# Patient Record
Sex: Female | Born: 1975 | Race: White | Hispanic: No | Marital: Married | Smoking: Former smoker
Health system: Southern US, Community
[De-identification: ages and names within clinical notes are randomized; demographics above are authoritative.]

## PROBLEM LIST (undated history)

## (undated) DIAGNOSIS — N83209 Unspecified ovarian cyst, unspecified side: Secondary | ICD-10-CM

## (undated) HISTORY — PX: CHOLECYSTECTOMY: SHX55

## (undated) HISTORY — PX: LAPAROSCOPIC ABDOMINAL EXPLORATION: SHX6249

---

## 2013-09-12 ENCOUNTER — Encounter (HOSPITAL_COMMUNITY): Payer: Self-pay | Admitting: *Deleted

## 2013-09-12 ENCOUNTER — Inpatient Hospital Stay (HOSPITAL_COMMUNITY)
Admission: AD | Admit: 2013-09-12 | Discharge: 2013-09-13 | Disposition: A | Discharge status: Home / Self Care | Payer: BC Managed Care – PPO | Source: Ambulatory Visit | Attending: Obstetrics & Gynecology | Admitting: Obstetrics & Gynecology

## 2013-09-12 ENCOUNTER — Inpatient Hospital Stay (HOSPITAL_COMMUNITY): Payer: BC Managed Care – PPO

## 2013-09-12 DIAGNOSIS — IMO0002 Reserved for concepts with insufficient information to code with codable children: Secondary | ICD-10-CM | POA: Insufficient documentation

## 2013-09-12 DIAGNOSIS — N3000 Acute cystitis without hematuria: Secondary | ICD-10-CM

## 2013-09-12 DIAGNOSIS — R51 Headache: Secondary | ICD-10-CM | POA: Insufficient documentation

## 2013-09-12 DIAGNOSIS — R109 Unspecified abdominal pain: Secondary | ICD-10-CM | POA: Insufficient documentation

## 2013-09-12 DIAGNOSIS — Z87891 Personal history of nicotine dependence: Secondary | ICD-10-CM | POA: Insufficient documentation

## 2013-09-12 DIAGNOSIS — G8918 Other acute postprocedural pain: Secondary | ICD-10-CM | POA: Insufficient documentation

## 2013-09-12 DIAGNOSIS — N9989 Other postprocedural complications and disorders of genitourinary system: Secondary | ICD-10-CM | POA: Insufficient documentation

## 2013-09-12 DIAGNOSIS — Y838 Other surgical procedures as the cause of abnormal reaction of the patient, or of later complication, without mention of misadventure at the time of the procedure: Secondary | ICD-10-CM | POA: Insufficient documentation

## 2013-09-12 DIAGNOSIS — N3001 Acute cystitis with hematuria: Secondary | ICD-10-CM

## 2013-09-12 DIAGNOSIS — N390 Urinary tract infection, site not specified: Secondary | ICD-10-CM | POA: Insufficient documentation

## 2013-09-12 HISTORY — DX: Unspecified ovarian cyst, unspecified side: N83.209

## 2013-09-12 LAB — CBC
HCT: 39.7 % (ref 36.0–46.0)
Hemoglobin: 12.4 g/dL (ref 12.0–15.0)
MCH: 29 pg (ref 26.0–34.0)
MCHC: 31.2 g/dL (ref 30.0–36.0)
MCV: 92.8 fL (ref 78.0–100.0)
Platelets: 199 10*3/uL (ref 150–400)
RBC: 4.28 MIL/uL (ref 3.87–5.11)
RDW: 13.5 % (ref 11.5–15.5)
WBC: 11.2 10*3/uL — AB (ref 4.0–10.5)

## 2013-09-12 LAB — URINE MICROSCOPIC-ADD ON

## 2013-09-12 LAB — URINALYSIS, ROUTINE W REFLEX MICROSCOPIC
Bilirubin Urine: NEGATIVE
Glucose, UA: NEGATIVE mg/dL
KETONES UR: NEGATIVE mg/dL
NITRITE: NEGATIVE
PH: 5 (ref 5.0–8.0)
Protein, ur: NEGATIVE mg/dL
Specific Gravity, Urine: 1.01 (ref 1.005–1.030)
UROBILINOGEN UA: 0.2 mg/dL (ref 0.0–1.0)

## 2013-09-12 LAB — COMPREHENSIVE METABOLIC PANEL
ALBUMIN: 4 g/dL (ref 3.5–5.2)
ALT: 44 U/L — ABNORMAL HIGH (ref 0–35)
AST: 50 U/L — ABNORMAL HIGH (ref 0–37)
Alkaline Phosphatase: 69 U/L (ref 39–117)
Anion gap: 12 (ref 5–15)
BUN: 13 mg/dL (ref 6–23)
CHLORIDE: 103 meq/L (ref 96–112)
CO2: 29 mEq/L (ref 19–32)
CREATININE: 0.68 mg/dL (ref 0.50–1.10)
Calcium: 9.2 mg/dL (ref 8.4–10.5)
GFR calc Af Amer: >90 mL/min (ref >90)
GFR calc non Af Amer: >90 mL/min (ref >90)
Glucose, Bld: 91 mg/dL (ref 70–99)
Potassium: 4.4 mEq/L (ref 3.7–5.3)
Sodium: 144 mEq/L (ref 137–147)
TOTAL PROTEIN: 7.2 g/dL (ref 6.0–8.3)
Total Bilirubin: 0.3 mg/dL (ref 0.3–1.2)

## 2013-09-12 MED ORDER — PHENAZOPYRIDINE HCL 200 MG PO TABS: 200.0000 mg | ORAL_TABLET | Freq: Three times a day (TID) | ORAL | Status: AC

## 2013-09-12 MED ORDER — HYDROMORPHONE HCL PF 1 MG/ML IJ SOLN
1.0000 mg | Freq: Once | INTRAMUSCULAR | Status: AC
  Administered 2013-09-12: 1 mg via INTRAVENOUS
  Filled 2013-09-12: qty 1

## 2013-09-12 MED ORDER — PHENAZOPYRIDINE HCL 100 MG PO TABS
200.0000 mg | ORAL_TABLET | Freq: Once | ORAL | Status: AC
  Administered 2013-09-12: 200 mg via ORAL
  Filled 2013-09-12: qty 2

## 2013-09-12 MED ORDER — DEXTROSE 5 % IV SOLN
1.0000 g | Freq: Once | INTRAVENOUS | Status: AC
  Administered 2013-09-12: 1 g via INTRAVENOUS
  Filled 2013-09-12: qty 10

## 2013-09-12 MED ORDER — HYDROMORPHONE HCL PF 2 MG/ML IJ SOLN
2.0000 mg | Freq: Once | INTRAMUSCULAR | Status: AC
  Administered 2013-09-12: 2 mg via INTRAMUSCULAR
  Filled 2013-09-12: qty 1

## 2013-09-12 MED ORDER — CIPROFLOXACIN HCL 250 MG PO TABS: 250.0000 mg | ORAL_TABLET | Freq: Two times a day (BID) | ORAL | Status: AC

## 2013-09-12 MED ORDER — IOHEXOL 300 MG/ML  SOLN
100.0000 mL | Freq: Once | INTRAMUSCULAR | Status: AC | PRN
  Administered 2013-09-12: 100 mL via INTRAVENOUS

## 2013-09-12 MED ORDER — SODIUM CHLORIDE 0.9 % IV SOLN
INTRAVENOUS | Status: DC
  Administered 2013-09-12: 21:00:00 via INTRAVENOUS

## 2013-09-12 MED ORDER — IOHEXOL 300 MG/ML  SOLN
50.0000 mL | INTRAMUSCULAR | Status: AC
  Administered 2013-09-12 (×2): 50 mL via ORAL

## 2013-09-12 NOTE — Discharge Instructions (Signed)

## 2013-09-12 NOTE — MAU Provider Note (Addendum)
Chief Complaint: Post-op Problem, Abdominal Pain and Headache   First Provider Initiated Contact with Patient 09/12/2013 at 1742.   SUBJECTIVE HPI: Diana Kline is a 38 y.o. G1P1001 female 2 days post-op cystectomy, bilateral salpingectomy, D&C and Mirena insertion at [facility] who presents with worsening low abd pain radiating to her back, urgency, feeling of incomplete emptying of bladder. Taking percocet and Ibuprofen w/ minimal relief of pain. Last Percocet at 1400 (2 tabs).   Surgery was performed by Dr. Jeanella CrazePierce. Records not available in Epic. [facility] because she is staying with her mother after the surgery and [place].  History reviewed. No pertinent past medical history. OB History  No data available   History reviewed. No pertinent past surgical history. History   Social History  . Marital Status: Married    Spouse Name: N/A    Number of Children: N/A  . Years of Education: N/A   Occupational History  . Not on file.   Social History Main Topics  . Smoking status: Not on file  . Smokeless tobacco: Not on file  . Alcohol Use: Not on file  . Drug Use: Not on file  . Sexual Activity: Not on file   Other Topics Concern  . Not on file   Social History Narrative  . No narrative on file   No current facility-administered medications on file prior to encounter.   No current outpatient prescriptions on file prior to encounter.   Allergies  Allergen Reactions  . Erythromycin Other (See Comments)    Stomach upset    ROS: Pertinent items in HPI. Also positive for vomiting x1, vaginal spotting. Negative for fever, chills, flank pain, hematuria, nausea, diarrhea, constipation. A small amount of bright red bleeding on bandage covering umbilical incision yesterday. Has not looked at incision and again today.  Last bowel movement last night, normal. Normal appetite.  OBJECTIVE Blood pressure 141/82, pulse 77, temperature  98.7 F (37.1 C), temperature source Oral, resp. rate 20, height 5\' 6"  (1.676 m), weight 127.642 kg (281 lb 6.4 oz), SpO2 98.00%. GENERAL: Well-developed, well-nourished, obese female in moderate distress. Tearful. HEENT: Normocephalic HEART: normal rate RESP: normal effort ABDOMEN: Mildly distended, generalized tenderness, but more across low abdomen along trocar paths and and epigastric area. Positive bowel sounds x4. No CVA tenderness. All 3 laparoscopy incision sites well approximated. No active bleeding, drainage or erythema. Old blood cleaned from umbilical incision. EXTREMITIES: Nontender, no edema NEURO: Alert and oriented SPECULUM EXAM: NEFG, deferred BIMANUAL: cervix closed; uterus size difficult to assess due to body habitus, mild-moderate bilateral adnexal tenderness (appropriate post-op). No masses.  LAB RESULTS Results for orders placed during the hospital encounter of 09/12/13 (from the past 24 hour(s))  URINALYSIS, ROUTINE W REFLEX MICROSCOPIC     Status: Abnormal   Collection Time    09/12/13  7:15 PM      Result Value Ref Range   Color, Urine YELLOW  YELLOW   APPearance CLEAR  CLEAR   Specific Gravity, Urine 1.010  1.005 - 1.030   pH 5.0  5.0 - 8.0   Glucose, UA NEGATIVE  NEGATIVE mg/dL   Hgb urine dipstick LARGE (*) NEGATIVE   Bilirubin Urine NEGATIVE  NEGATIVE   Ketones, ur NEGATIVE  NEGATIVE mg/dL   Protein, ur NEGATIVE  NEGATIVE mg/dL   Urobilinogen, UA 0.2  0.0 - 1.0 mg/dL   Nitrite NEGATIVE  NEGATIVE   Leukocytes, UA SMALL (*) NEGATIVE  URINE MICROSCOPIC-ADD ON  Status: Abnormal   Collection Time    09/12/13  7:15 PM      Result Value Ref Range   Squamous Epithelial / LPF FEW (*) RARE   WBC, UA 3-6  <3 WBC/hpf   RBC / HPF 7-10  <3 RBC/hpf   Bacteria, UA RARE  RARE  CBC     Status: Abnormal   Collection Time    09/12/13  7:20 PM      Result Value Ref Range   WBC 11.2 (*) 4.0 - 10.5 K/uL   RBC 4.28  3.87 - 5.11 MIL/uL   Hemoglobin 12.4  12.0 - 15.0  g/dL   HCT 16.1  09.6 - 04.5 %   MCV 92.8  78.0 - 100.0 fL   MCH 29.0  26.0 - 34.0 pg   MCHC 31.2  30.0 - 36.0 g/dL   RDW 40.9  81.1 - 91.4 %   Platelets 199  150 - 400 K/uL  COMPREHENSIVE METABOLIC PANEL     Status: Abnormal   Collection Time    09/12/13  7:20 PM      Result Value Ref Range   Sodium 144  137 - 147 mEq/L   Potassium 4.4  3.7 - 5.3 mEq/L   Chloride 103  96 - 112 mEq/L   CO2 29  19 - 32 mEq/L   Glucose, Bld 91  70 - 99 mg/dL   BUN 13  6 - 23 mg/dL   Creatinine, Ser 7.82  0.50 - 1.10 mg/dL   Calcium 9.2  8.4 - 95.6 mg/dL   Total Protein 7.2  6.0 - 8.3 g/dL   Albumin 4.0  3.5 - 5.2 g/dL   AST 50 (*) 0 - 37 U/L   ALT 44 (*) 0 - 35 U/L   Alkaline Phosphatase 69  39 - 117 U/L   Total Bilirubin 0.3  0.3 - 1.2 mg/dL   GFR calc non Af Amer >90  >90 mL/min   GFR calc Af Amer >90  >90 mL/min   Anion gap 12  5 - 15    IMAGING No results found.  MAU COURSE CBC, UA, CMET, Percocet, request records.  Bladder scanner shows 22 mm residual. Discussed elevated liver enzymes. Patient unaware of any history of same. Hepatitis panel ordered.  Discussed patient symptoms, exam and labs with Dr. Shawnie Pons. CT ordered. Will give Rocephin and pyridium for UTI.  Care of pt turned Joseph [place], [place].  [place], CNM 09/12/2013  9:06 PM   2100 - Care assumed from [place], CNM  Results for orders placed during the hospital encounter of 09/12/13 (from the past 24 hour(s))  URINALYSIS, ROUTINE W REFLEX MICROSCOPIC     Status: Abnormal   Collection Time    09/12/13  7:15 PM      Result Value Ref Range   Color, Urine YELLOW  YELLOW   APPearance CLEAR  CLEAR   Specific Gravity, Urine 1.010  1.005 - 1.030   pH 5.0  5.0 - 8.0   Glucose, UA NEGATIVE  NEGATIVE mg/dL   Hgb urine dipstick LARGE (*) NEGATIVE   Bilirubin Urine NEGATIVE  NEGATIVE   Ketones, ur NEGATIVE  NEGATIVE mg/dL   Protein, ur NEGATIVE  NEGATIVE mg/dL   Urobilinogen, UA 0.2  0.0 - 1.0 mg/dL   Nitrite  NEGATIVE  NEGATIVE   Leukocytes, UA SMALL (*) NEGATIVE  URINE MICROSCOPIC-ADD ON     Status: Abnormal   Collection Time    09/12/13  7:15  PM      Result Value Ref Range   Squamous Epithelial / LPF FEW (*) RARE   WBC, UA 3-6  <3 WBC/hpf   RBC / HPF 7-10  <3 RBC/hpf   Bacteria, UA RARE  RARE  CBC     Status: Abnormal   Collection Time    09/12/13  7:20 PM      Result Value Ref Range   WBC 11.2 (*) 4.0 - 10.5 K/uL   RBC 4.28  3.87 - 5.11 MIL/uL   Hemoglobin 12.4  12.0 - 15.0 g/dL   HCT 65.7  84.6 - 96.2 %   MCV 92.8  78.0 - 100.0 fL   MCH 29.0  26.0 - 34.0 pg   MCHC 31.2  30.0 - 36.0 g/dL   RDW 95.2  84.1 - 32.4 %   Platelets 199  150 - 400 K/uL  COMPREHENSIVE METABOLIC PANEL     Status: Abnormal   Collection Time    09/12/13  7:20 PM      Result Value Ref Range   Sodium 144  137 - 147 mEq/L   Potassium 4.4  3.7 - 5.3 mEq/L   Chloride 103  96 - 112 mEq/L   CO2 29  19 - 32 mEq/L   Glucose, Bld 91  70 - 99 mg/dL   BUN 13  6 - 23 mg/dL   Creatinine, Ser 4.01  0.50 - 1.10 mg/dL   Calcium 9.2  8.4 - 02.7 mg/dL   Total Protein 7.2  6.0 - 8.3 g/dL   Albumin 4.0  3.5 - 5.2 g/dL   AST 50 (*) 0 - 37 U/L   ALT 44 (*) 0 - 35 U/L   Alkaline Phosphatase 69  39 - 117 U/L   Total Bilirubin 0.3  0.3 - 1.2 mg/dL   GFR calc non Af Amer >90  >90 mL/min   GFR calc Af Amer >90  >90 mL/min   Anion gap 12  5 - 15    Ct Abdomen Pelvis W Contrast  09/12/2013   CLINICAL DATA:  Worsening low abdominal pain radiating to the back, urgency, feeling of incomplete emptying of the bladder. Patient is postop day 2 from resection of pelvic mass and bilateral fallopian tubes.  EXAM: CT ABDOMEN AND PELVIS WITH CONTRAST  TECHNIQUE: Multidetector CT imaging of the abdomen and pelvis was performed using the standard protocol following bolus administration of intravenous contrast.  CONTRAST:  OMNIPAQUE IOHEXOL 300 MG/ML  SOLN  COMPARISON:  None.  FINDINGS: The lung bases are clear.  Surgical absence of the  gallbladder. Mild diffuse fatty infiltration of the liver. The pancreas, spleen, adrenal glands, kidneys, abdominal aorta, inferior vena cava, and retroperitoneal lymph nodes are unremarkable. Stomach, small bowel, and colon are normal for degree of distention. Small umbilical hernia containing fat. No free air or free fluid in the abdomen.  Pelvis: Ovaries are mildly prominent in size but appear symmetrical. Uterus is anteverted with intrauterine device in place. No pelvic mass or lymphadenopathy is identified. No free or loculated pelvic fluid collections. Bladder wall is not thickened and the bladder is not abnormally distended. Appendix is normal. No inflammatory changes. Normal alignment of the lumbar spine. No destructive bone lesions.  IMPRESSION: No acute process demonstrated in the abdomen or pelvis. Intrauterine device is in place. Ovaries are symmetrical in size. No bladder wall thickening or abnormal bladder distention.   Electronically Signed   By: Burman Nieves M.D.   On: 09/12/2013  23:36   A: UTI Post-operative pain  P: Discharge home Rx for Cipro and Pyridium given to patient Patient advised to continue previously prescribed pain medications Patient advised to follow-up with surgeon as scheduled for post operative appointment Patient may return to MAU as needed or if her condition were to change or worsen  Freddi Starr, PA-C 09/12/2013 9:27 PM

## 2013-09-12 NOTE — MAU Provider Note (Signed)
Attestation of Attending Supervision of Advanced Practitioner (PA/CNM/NP): Evaluation and management procedures were performed by the Advanced Practitioner under my supervision and collaboration.  I have reviewed the Advanced Practitioner's note and chart, and I agree with the management and plan.  Reva BoresPRATT,Izola Teague S, MD [facility] Attending 09/12/2013 9:14 PM

## 2013-09-12 NOTE — MAU Note (Signed)
Patient states she had surgery [facility] on 7-29. States she had a 10cm cyst between the tubes and had both tubes removed, had a D & C with a Mirena IUD placed. States biopsys are pending on the cyst and the ovary. Has multiple small cysts on both ovaries. States she is staying in [place] with her mother and went to Urgent Care and was told [facility]. Has had pain since the surgery but since last night is worse and is getting worse, radiates to both sides and back. Feels clammy, has headaches and cramping. Slight vaginal spotting. Has had difficulty with urination, urgency but feels like she is not able to empty.

## 2013-09-13 LAB — HEPATITIS PANEL, ACUTE
HCV Ab: NEGATIVE
HEP B C IGM: NONREACTIVE
HEP B S AG: NEGATIVE
Hep A IgM: NONREACTIVE

## 2013-09-13 MED ORDER — OXYCODONE-ACETAMINOPHEN 5-325 MG PO TABS: 1.0000 | ORAL_TABLET | ORAL | Status: AC | PRN

## 2013-09-13 NOTE — MAU Provider Note (Signed)
Attestation of Attending Supervision of Advanced Practitioner (PA/CNM/NP): Evaluation and management procedures were performed by the Advanced Practitioner under my supervision and collaboration.  I have reviewed the Advanced Practitioner's note and chart, and I agree with the management and plan.  Arliss Frisina S, MD [facility] Attending 09/13/2013 7:13 AM   

## 2013-09-15 LAB — URINE CULTURE: Colony Count: >=100000

## 2013-09-16 ENCOUNTER — Other Ambulatory Visit: Payer: Self-pay | Admitting: Family

## 2013-09-16 MED ORDER — SULFAMETHOXAZOLE-TRIMETHOPRIM 800-160 MG PO TABS: 1.0000 | ORAL_TABLET | Freq: Two times a day (BID) | ORAL | Status: AC

## 2013-12-16 ENCOUNTER — Encounter (HOSPITAL_COMMUNITY): Payer: Self-pay | Admitting: *Deleted

## 2015-11-11 IMAGING — CT CT ABD-PELV W/ CM
2 of 8 series · 17 of 46 positions shown, 19 images · IV contrast (OMNIPAQUE)
Comparison: None.

CLINICAL DATA: Worsening low abdominal pain radiating to the back,
urgency, feeling of incomplete emptying of the bladder. Patient is
postop day 2 from resection of pelvic mass and bilateral fallopian
tubes.

EXAM:
CT ABDOMEN AND PELVIS WITH CONTRAST
TECHNIQUE: Multidetector CT imaging of the abdomen and pelvis was performed
using the standard protocol following bolus administration of
intravenous contrast.
CONTRAST:  100mL OMNIPAQUE IOHEXOL 300 MG/ML  SOLN

[Series 5: (person_name) thins · axial · 0.78mm/px · z∈[-316,+103]mm · 14 of 667 slices shown, 16 images]
[im 34/667  soft-tissue]
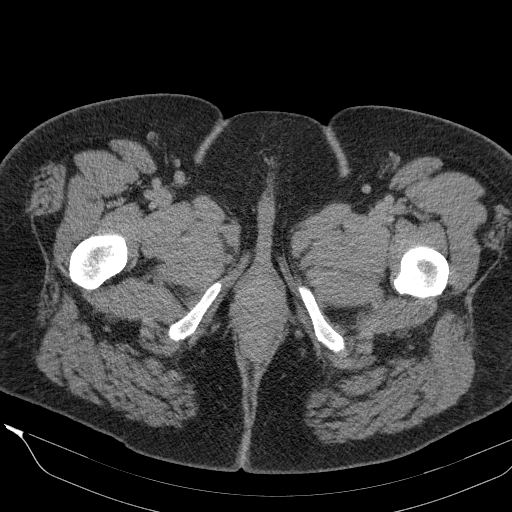
[im 34/667  bone]
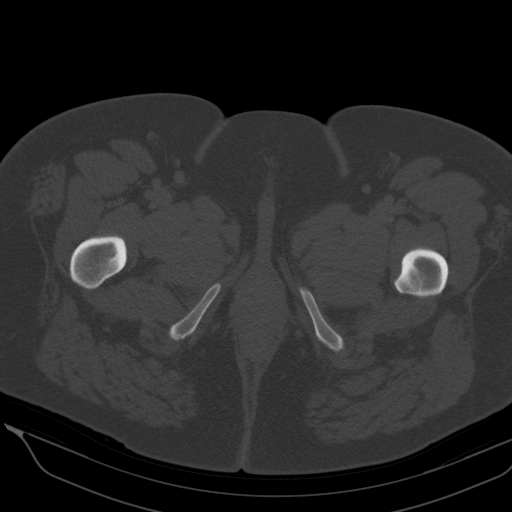
[im 100/667  soft-tissue]
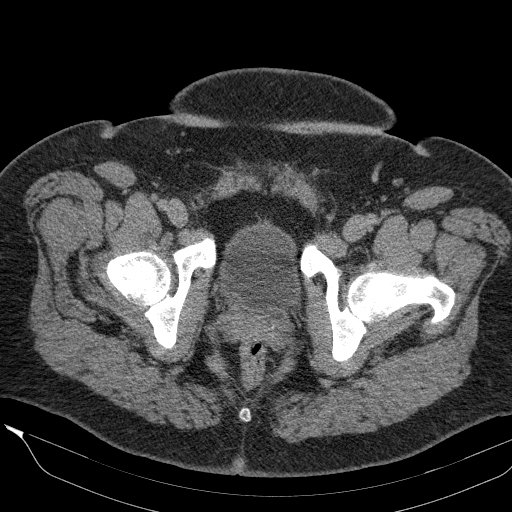
[im 134/667  soft-tissue]
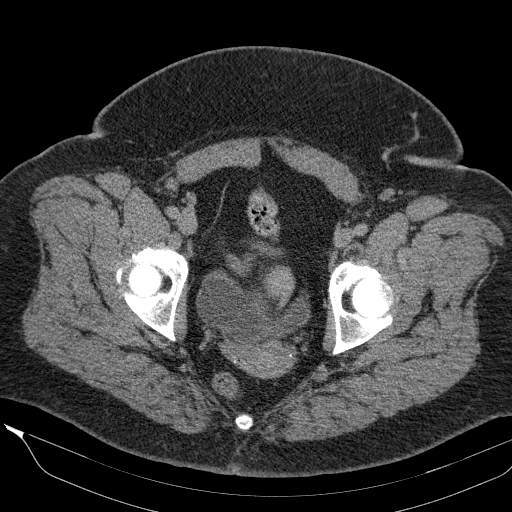
[im 167/667  soft-tissue]
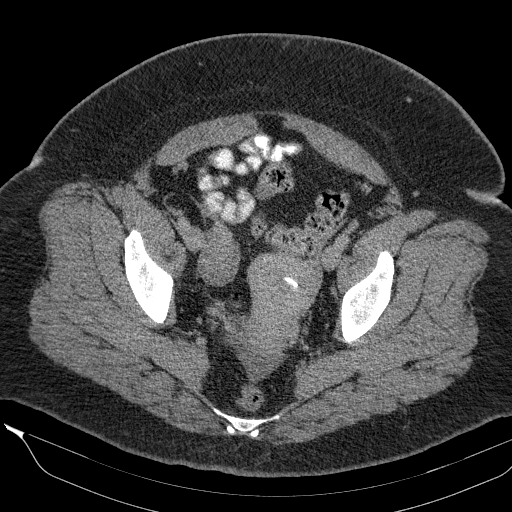
[im 234/667  soft-tissue]
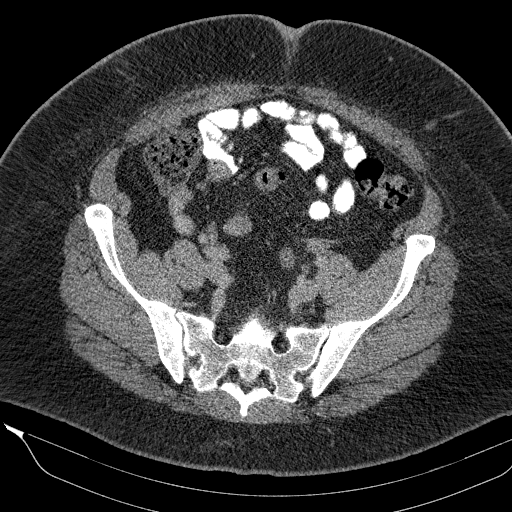
[im 267/667  soft-tissue]
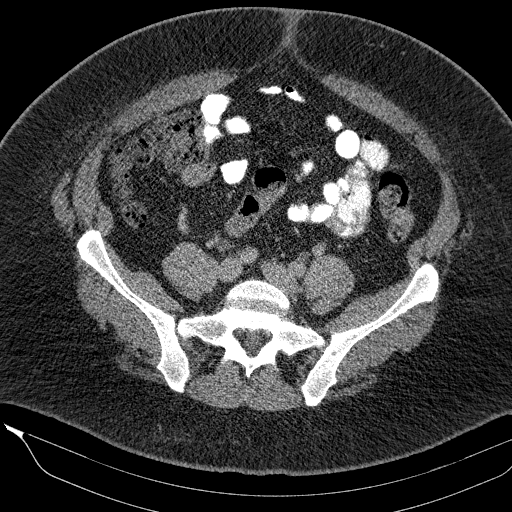
[im 300/667  soft-tissue]
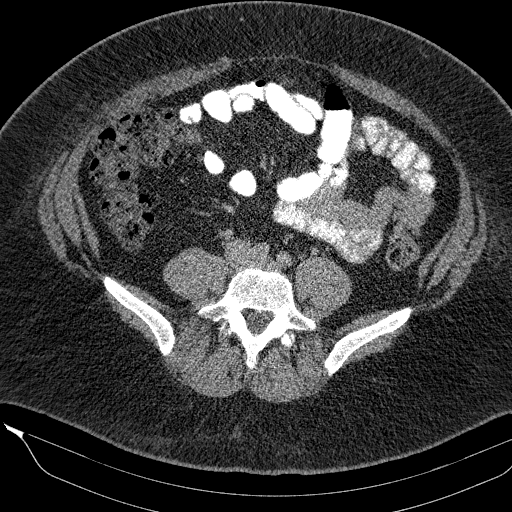
[im 367/667  soft-tissue]
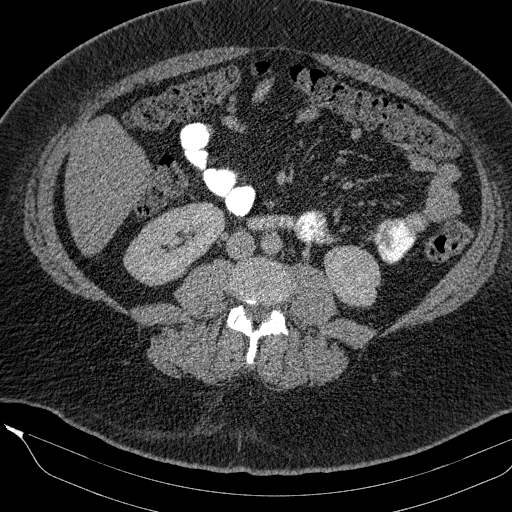
[im 400/667  soft-tissue]
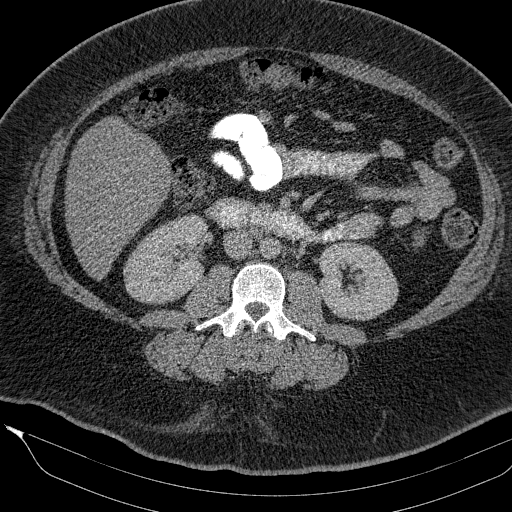
[im 400/667  bone]
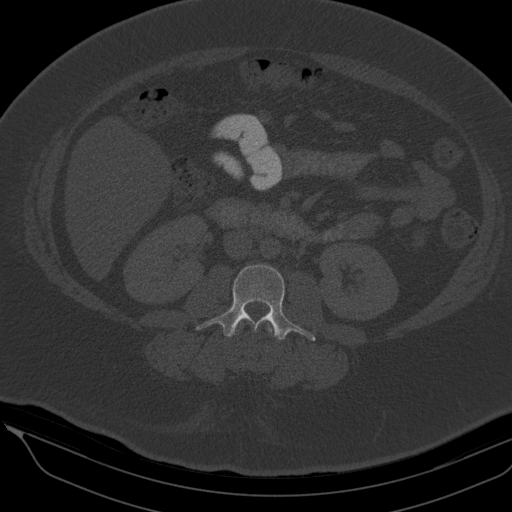
[im 433/667  soft-tissue]
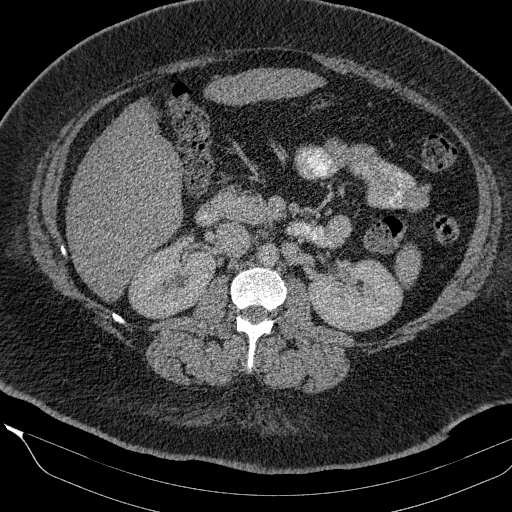
[im 500/667  soft-tissue]
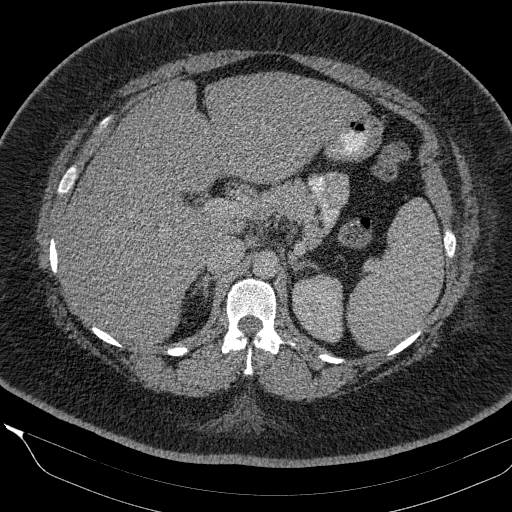
[im 533/667  soft-tissue]
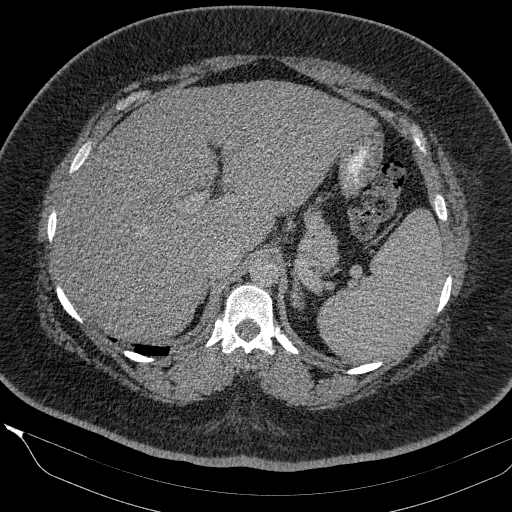
[im 567/667  soft-tissue]
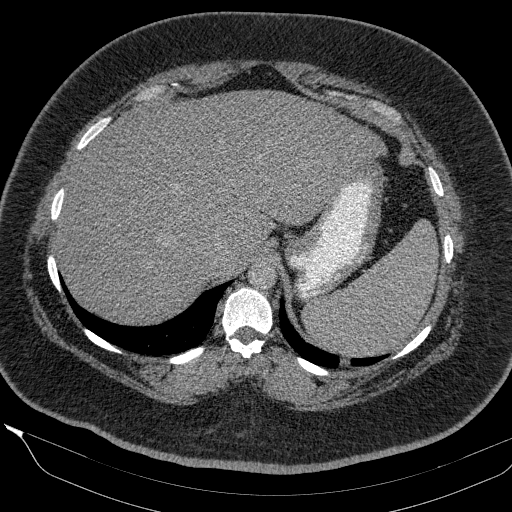
[im 633/667  soft-tissue]
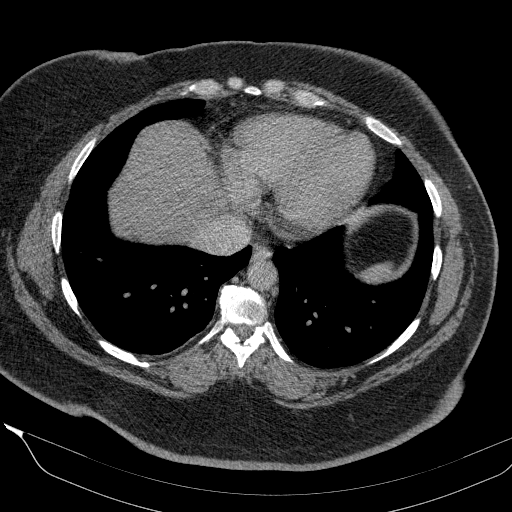

[Series 602: coronal · coronal · 0.91mm/px · 3 of 151 slices shown]
[im 51/151  soft-tissue]
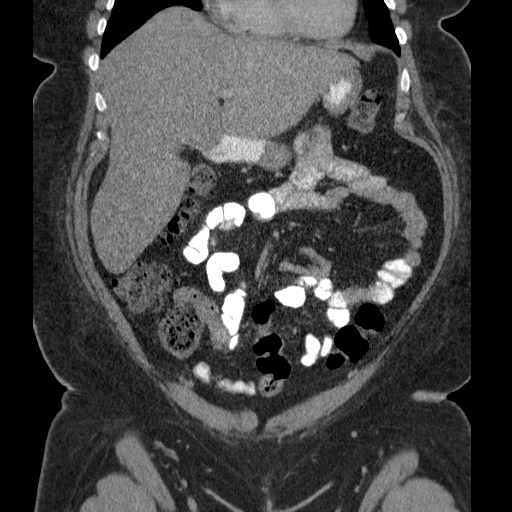
[im 67/151  soft-tissue]
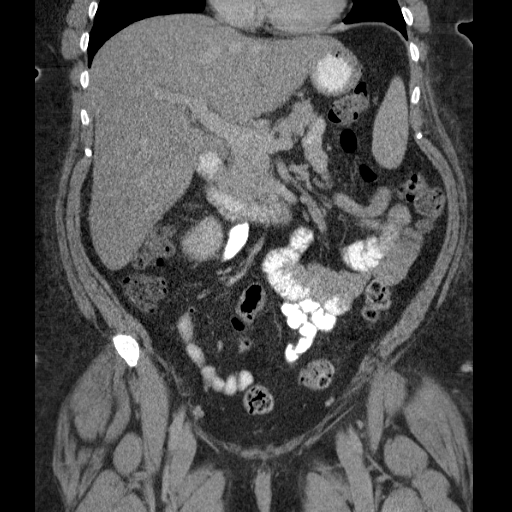
[im 84/151  soft-tissue]
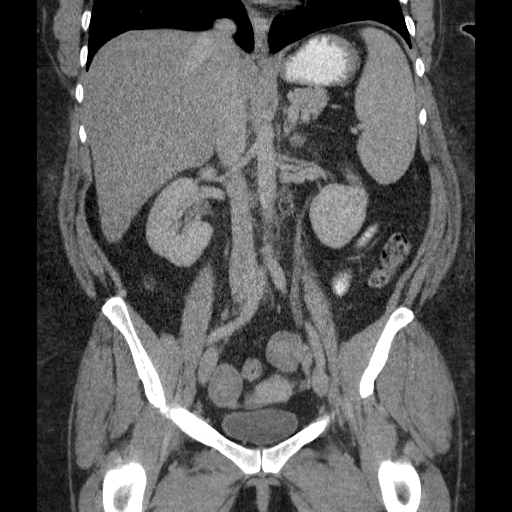

[17 of 46 positions shown; findings below may reference images not displayed]

FINDINGS: The lung bases are clear.

Surgical absence of the gallbladder. Mild diffuse fatty infiltration
of the liver. The pancreas, spleen, adrenal glands, kidneys,
abdominal aorta, inferior vena cava, and retroperitoneal lymph nodes
are unremarkable. Stomach, small bowel, and colon are normal for
degree of distention. Small umbilical hernia containing fat. No free
air or free fluid in the abdomen.

Pelvis: Ovaries are mildly prominent in size but appear symmetrical.
Uterus is anteverted with intrauterine device in place. No pelvic
mass or lymphadenopathy is identified. No free or loculated pelvic
fluid collections. Bladder wall is not thickened and the bladder is
not abnormally distended. Appendix is normal. No inflammatory
changes. Normal alignment of the lumbar spine. No destructive bone
lesions.
IMPRESSION: No acute process demonstrated in the abdomen or pelvis. Intrauterine
device is in place. Ovaries are symmetrical in size. No bladder wall
thickening or abnormal bladder distention.
